# Patient Record
Sex: Female | Born: 1980 | Hispanic: Yes | Marital: Single | Smoking: Never smoker
Health system: Southern US, Community
[De-identification: ages and names within clinical notes are randomized; demographics above are authoritative.]

## PROBLEM LIST (undated history)

## (undated) DIAGNOSIS — E785 Hyperlipidemia, unspecified: Secondary | ICD-10-CM

## (undated) DIAGNOSIS — G43909 Migraine, unspecified, not intractable, without status migrainosus: Secondary | ICD-10-CM

## (undated) HISTORY — DX: Hyperlipidemia, unspecified: E78.5

## (undated) HISTORY — DX: Migraine, unspecified, not intractable, without status migrainosus: G43.909

---

## 2009-11-21 ENCOUNTER — Emergency Department (HOSPITAL_COMMUNITY): Admission: EM | Admit: 2009-11-21 | Discharge: 2009-11-21 | Payer: Self-pay | Admitting: Emergency Medicine

## 2011-08-30 IMAGING — CT CT HEAD W/O CM
1 series · 16 of 30 positions shown, 20 images · non-contrast
Comparison: None

CLINICAL DATA: Severe headache.

CT HEAD WITHOUT CONTRAST
TECHNIQUE: Contiguous axial images were obtained from the base of
the skull through the vertex without contrast

[Series 2: head_seq 4.5 h37s st · axial · 0.43mm/px · z∈[-77,+49]mm · 16 of 32 slices shown, 20 images]
[im 2/32  brain]
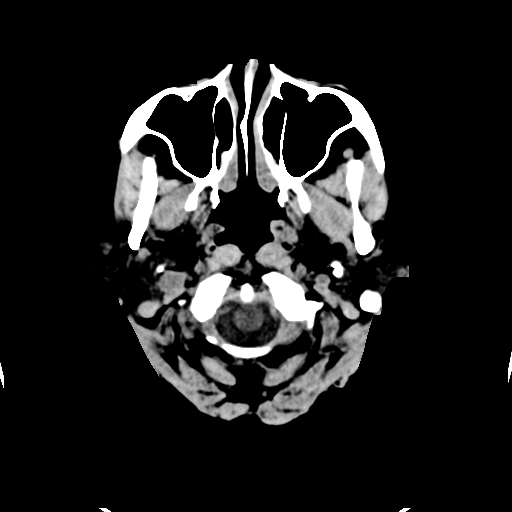
[im 2/32  bone]
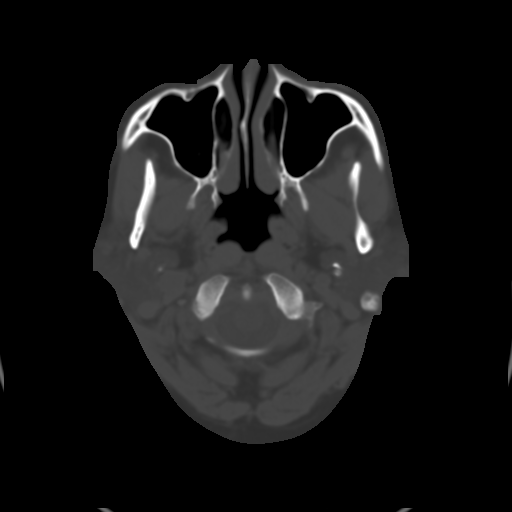
[im 4/32  brain]
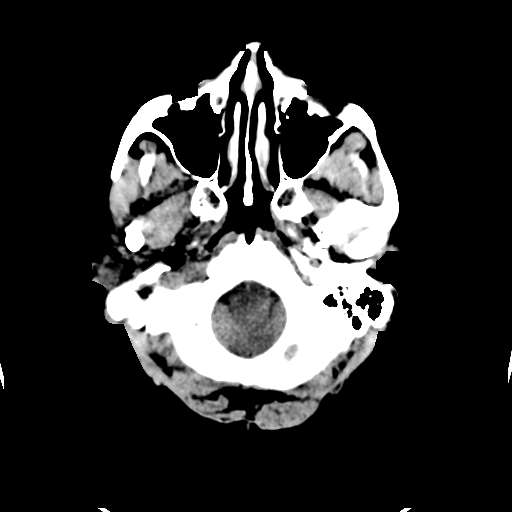
[im 6/32  brain]
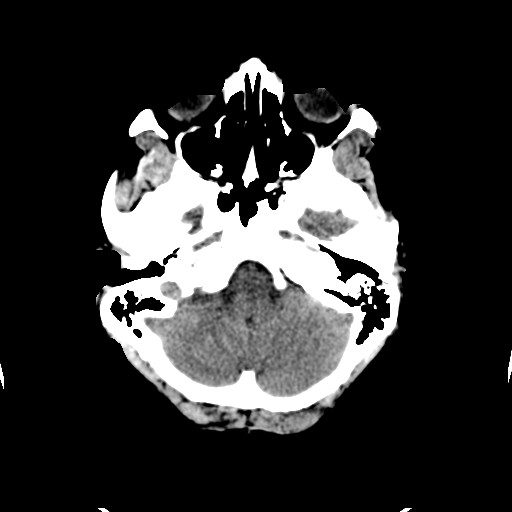
[im 8/32  brain]
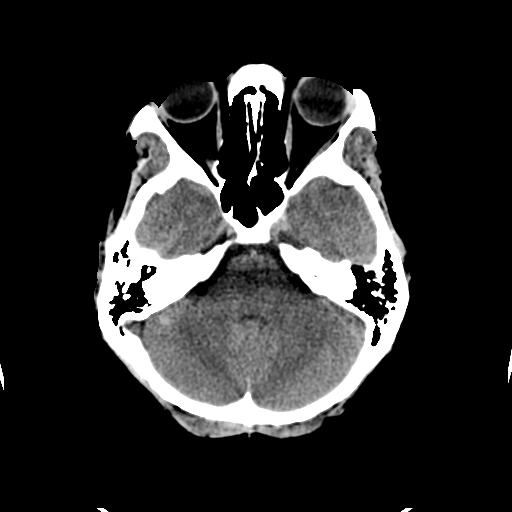
[im 9/32  brain]
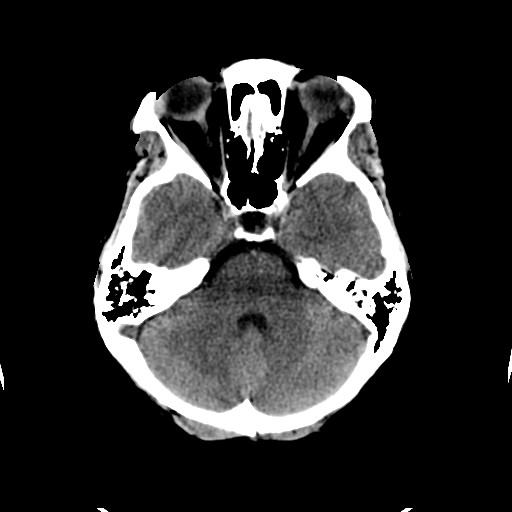
[im 9/32  bone]
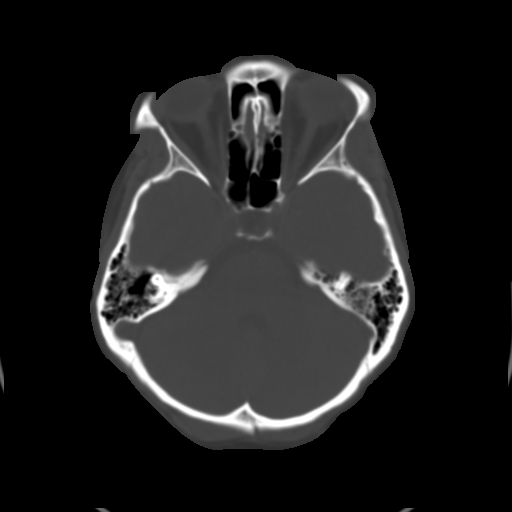
[im 11/32  brain]
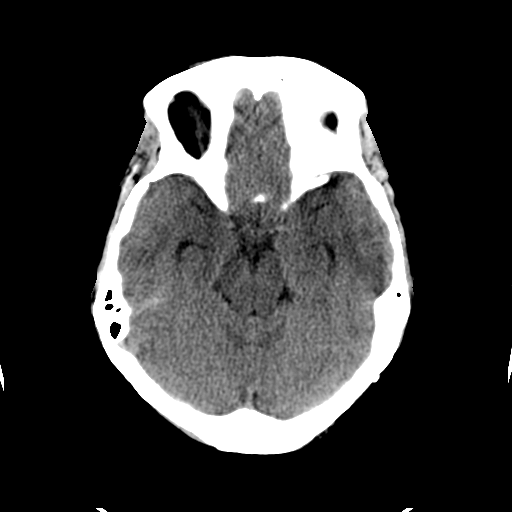
[im 13/32  brain]
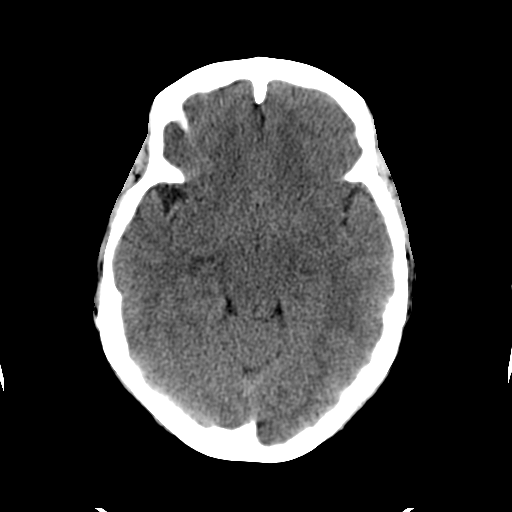
[im 15/32  brain]
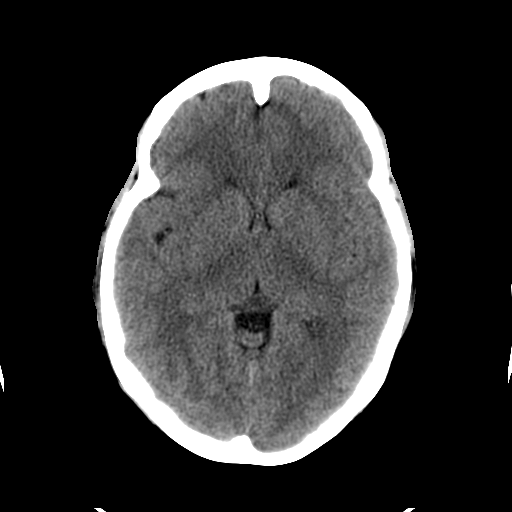
[im 17/32  brain]
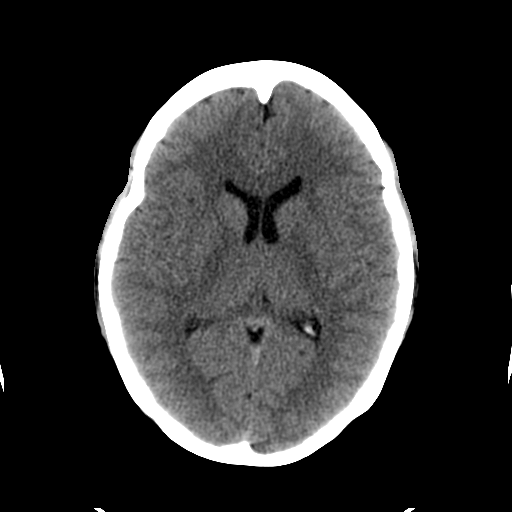
[im 17/32  bone]
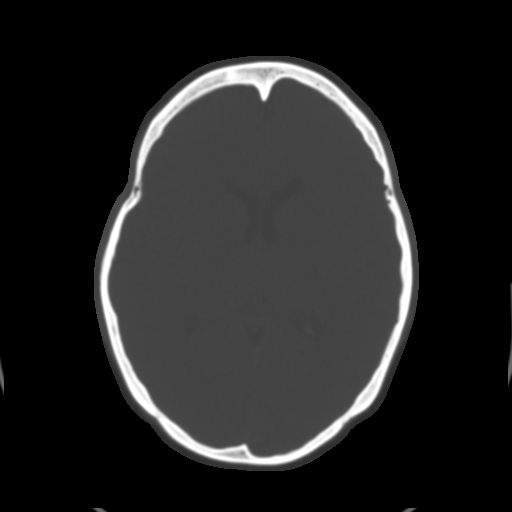
[im 19/32  brain]
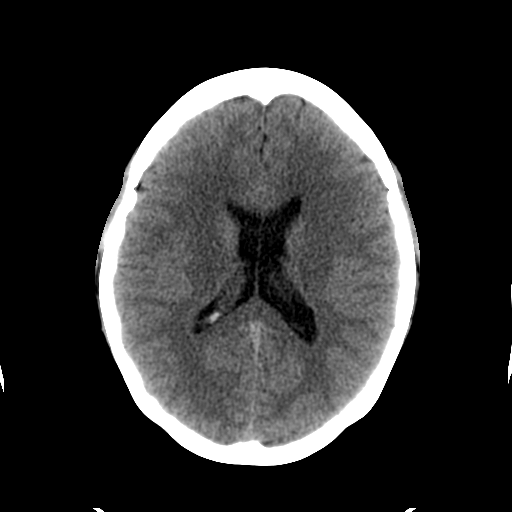
[im 21/32  brain]
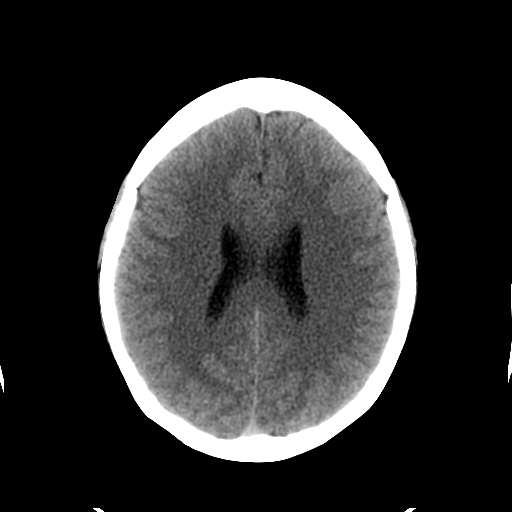
[im 23/32  brain]
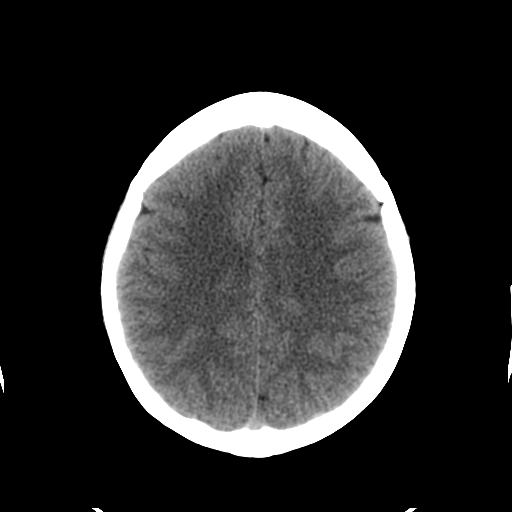
[im 24/32  brain]
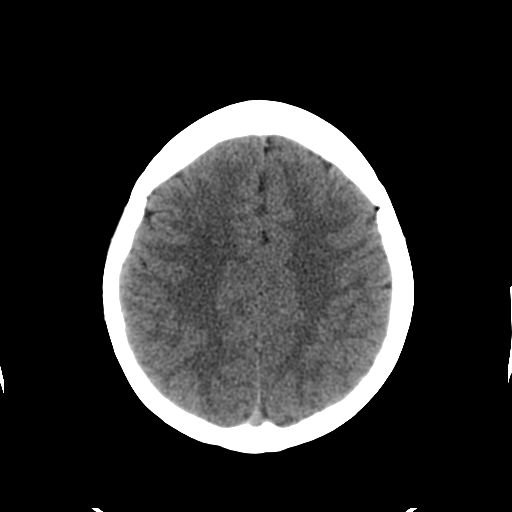
[im 24/32  bone]
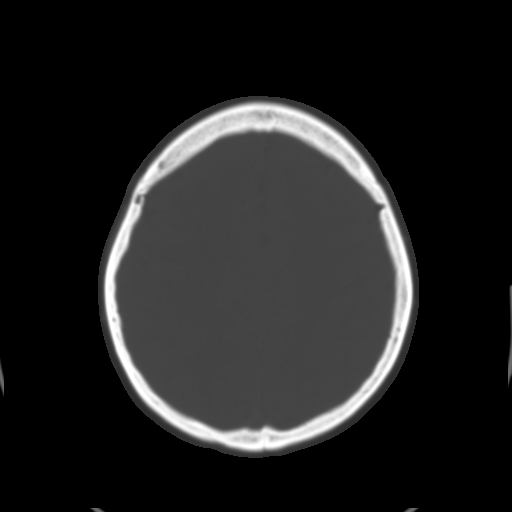
[im 26/32  brain]
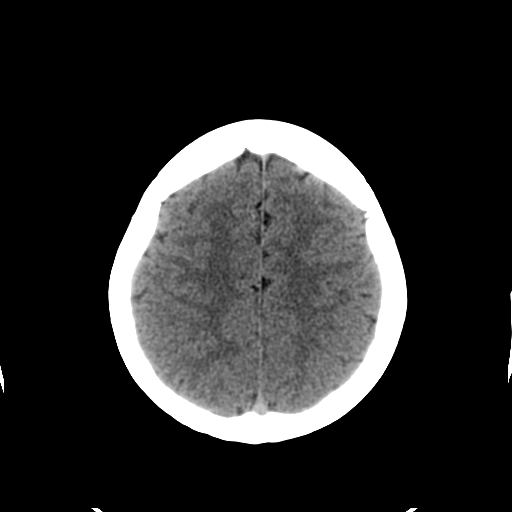
[im 28/32  brain]
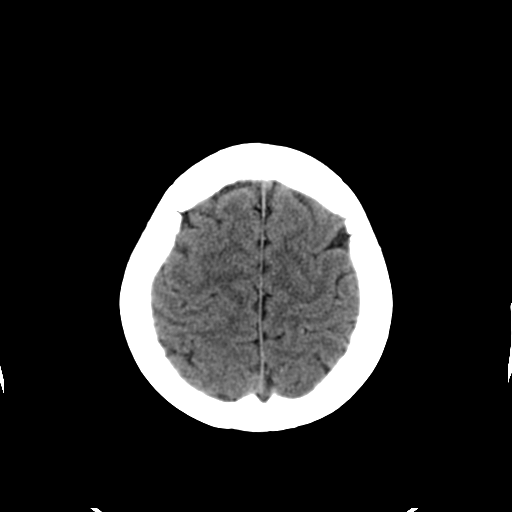
[im 30/32  brain]
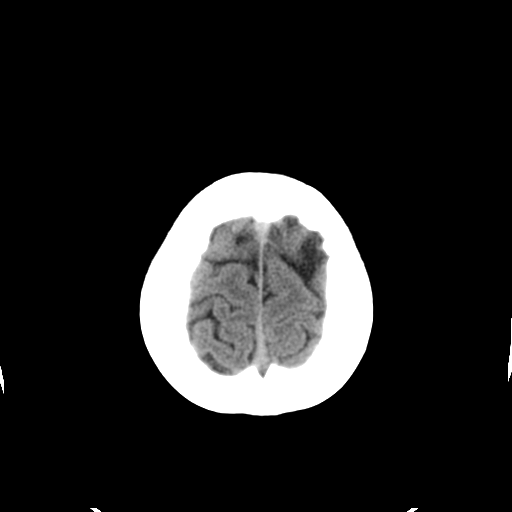

[16 of 30 positions shown; findings below may reference images not displayed]

FINDINGS: There is no evidence of intracranial hemorrhage, brain
edema, or other signs of acute infarction.  There is no evidence of
intracranial mass lesion or mass effect.  No abnormal extraaxial
fluid collections are identified.  There is no evidence of
hydrocephalus, or other significant intracranial abnormality.  No
skull abnormality identified.
IMPRESSION: Negative non-contrast head CT.

## 2014-12-04 ENCOUNTER — Encounter: Payer: Self-pay | Admitting: *Deleted

## 2014-12-05 ENCOUNTER — Encounter: Payer: Self-pay | Admitting: *Deleted

## 2014-12-06 ENCOUNTER — Telehealth: Payer: Self-pay | Admitting: *Deleted

## 2014-12-06 ENCOUNTER — Ambulatory Visit: Payer: Self-pay | Admitting: Diagnostic Neuroimaging

## 2014-12-06 NOTE — Telephone Encounter (Signed)
No showed new patient appointment. 

## 2014-12-09 ENCOUNTER — Encounter: Payer: Self-pay | Admitting: Diagnostic Neuroimaging
